# Patient Record
Sex: Female | Born: 1995 | Hispanic: No | Marital: Single | Smoking: Never smoker
Health system: Southern US, Community
[De-identification: ages and names within clinical notes are randomized; demographics above are authoritative.]

## PROBLEM LIST (undated history)

## (undated) DIAGNOSIS — R569 Unspecified convulsions: Secondary | ICD-10-CM

---

## 2017-02-13 ENCOUNTER — Emergency Department (HOSPITAL_COMMUNITY)
Admission: EM | Admit: 2017-02-13 | Discharge: 2017-02-13 | Disposition: A | Discharge status: Home / Self Care | Payer: PRIVATE HEALTH INSURANCE | Attending: Emergency Medicine | Admitting: Emergency Medicine

## 2017-02-13 ENCOUNTER — Encounter (HOSPITAL_COMMUNITY): Payer: Self-pay | Admitting: Emergency Medicine

## 2017-02-13 ENCOUNTER — Emergency Department (HOSPITAL_COMMUNITY): Payer: PRIVATE HEALTH INSURANCE

## 2017-02-13 DIAGNOSIS — Y929 Unspecified place or not applicable: Secondary | ICD-10-CM | POA: Insufficient documentation

## 2017-02-13 DIAGNOSIS — M25561 Pain in right knee: Secondary | ICD-10-CM

## 2017-02-13 DIAGNOSIS — Y998 Other external cause status: Secondary | ICD-10-CM | POA: Insufficient documentation

## 2017-02-13 DIAGNOSIS — M545 Low back pain, unspecified: Secondary | ICD-10-CM

## 2017-02-13 DIAGNOSIS — W010XXA Fall on same level from slipping, tripping and stumbling without subsequent striking against object, initial encounter: Secondary | ICD-10-CM | POA: Diagnosis not present

## 2017-02-13 DIAGNOSIS — Y9301 Activity, walking, marching and hiking: Secondary | ICD-10-CM | POA: Diagnosis not present

## 2017-02-13 DIAGNOSIS — W19XXXA Unspecified fall, initial encounter: Secondary | ICD-10-CM

## 2017-02-13 HISTORY — DX: Unspecified convulsions: R56.9

## 2017-02-13 LAB — POC URINE PREG, ED: PREG TEST UR: NEGATIVE

## 2017-02-13 MED ORDER — IBUPROFEN 400 MG PO TABS
600.0000 mg | ORAL_TABLET | Freq: Once | ORAL | Status: AC
  Administered 2017-02-13: 600 mg via ORAL
  Filled 2017-02-13: qty 1

## 2017-02-13 NOTE — ED Notes (Signed)
Signature pad in room not working at this time. Pt verbalized understanding off all provided materials

## 2017-02-13 NOTE — ED Triage Notes (Signed)
Pt was at [employer] and lost balance in a puddle- fell and hit her right knee. Felt a "pop." Hx of surgery to right knee. Pain in lower back as well. No head/neck pain. No LOC. Pt ambulatory on scene per EMS.

## 2017-02-13 NOTE — ED Provider Notes (Signed)
[facility] Provider Note   CSN: [id number] Arrival date & time: 02/13/17  [id number]     History   Chief Complaint Chief Complaint  Patient presents with  . Knee Pain    HPI Joyce Bender is a 21 y.o. female who presents with right knee pain and low back pain after a fall earlier tonight.  Past medical history significant for multiple surgeries to the right knee.  The patient is currently homeless and is from [place].  She was walking around in the rain all day today with a friend and slipped in a puddle and fell onto her right side and directly onto her right knee.  She reported immediate onset of right knee pain and low back pain especially on the right side.  She has been able to walk after the accident but is having a lot of pain.  No leg weakness, numbness, tingling.  HPI  Past Medical History:  Diagnosis Date  . Seizures (HCC)     There are no active problems to display for this patient.   History reviewed. No pertinent surgical history.  OB History    No data available       Home Medications    Prior to Admission medications   Not on File    Family History History reviewed. No pertinent family history.  Social History Social History   Tobacco Use  . Smoking status: Never Smoker  . Smokeless tobacco: Never Used  Substance Use Topics  . Alcohol use: No    Frequency: Never  . Drug use: No     Allergies   Tape   Review of Systems Review of Systems  Musculoskeletal: Positive for arthralgias and myalgias. Negative for gait problem.  Neurological: Negative for weakness and numbness.     Physical Exam Updated Vital Signs BP 136/74 (BP Location: Right Arm)   Pulse 87   Temp 98.5 F (36.9 C) (Oral)   Resp 15   Ht 5\' 4"  (1.626 m)   Wt 129.7 kg (286 lb)   LMP 12/02/2016   SpO2 100%   BMI 49.09 kg/m   Physical Exam  Constitutional: She is oriented to person, place, and time. She appears well-developed  and well-nourished. No distress.  Obese  HENT:  Head: Normocephalic and atraumatic.  Eyes: Conjunctivae are normal. Pupils are equal, round, and reactive to light. Right eye exhibits no discharge. Left eye exhibits no discharge. No scleral icterus.  Neck: Normal range of motion.  Cardiovascular: Normal rate.  Pulmonary/Chest: Effort normal. No respiratory distress.  Abdominal: She exhibits no distension.  Musculoskeletal:  Back: Inspection: No masses, deformity, or rash Palpation: Lumbar midline spinal tenderness with right sided paraspinal muscle tenderness. Strength: 5/5 in lower extremities and normal plantar and dorsiflexion Gait: Normal gait  Right knee: Multiple prior surgical scars. Tenderness over proximal tibia. No joint line tenderness or effusion. Decreased ROM due to pain. Ambulatory  Neurological: She is alert and oriented to person, place, and time.  Skin: Skin is warm and dry.  Psychiatric: She has a normal mood and affect. Her behavior is normal.  Nursing note and vitals reviewed.    ED Treatments / Results  Labs (all labs ordered are listed, but only abnormal results are displayed) Labs Reviewed  POC URINE PREG, ED    EKG  EKG Interpretation None       Radiology Dg Lumbar Spine Complete  Result Date: 02/13/2017 CLINICAL DATA:  Low back pain after fall. EXAM: LUMBAR  SPINE - COMPLETE 4+ VIEW COMPARISON:  None. FINDINGS: There is no evidence of lumbar spine fracture. Alignment is normal. Intervertebral disc spaces are maintained. IMPRESSION: Negative. Electronically Signed   By: Obie DredgeWilliam T Derry M.D.   On: 02/13/2017 20:26   Dg Knee Complete 4 Views Right  Result Date: 02/13/2017 CLINICAL DATA:  21 year old female with history of right-sided knee pain and low back pain, with recent history of fall and injury to the right knee. EXAM: RIGHT KNEE - COMPLETE 4+ VIEW COMPARISON:  No priors. FINDINGS: Four views of the right knee demonstrate no acute displaced  fracture, subluxation or dislocation. There are 2 fixation screws in the proximal tibia. Small amount of heterotopic ossification medial to the medial supracondylar region of the distal right femur. IMPRESSION: 1. No acute radiographic abnormality of the right knee. Electronically Signed   By: Trudie Reedaniel  Entrikin M.D.   On: 02/13/2017 20:27    Procedures Procedures (including critical care time)  Medications Ordered in ED Medications  ibuprofen (ADVIL,MOTRIN) tablet 600 mg (600 mg Oral Given 02/13/17 2059)     Initial Impression / Assessment and Plan / ED Course  I have reviewed the triage vital signs and the nursing notes.  Pertinent labs & imaging results that were available during my care of the patient were reviewed by me and considered in my medical decision making (see chart for details).  21 year old female presents with acute low back pain and right knee pain s/p fall. Xrays are negative. She was given Ibuprofen for pain and is ambulatory. Social work was consulted who [place]. She verbalized understanding.  Final Clinical Impressions(s) / ED Diagnoses   Final diagnoses:  Acute pain of right knee  Acute bilateral low back pain without sciatica  Fall, initial encounter    ED Discharge Orders    None       Bethel BornGekas, Dannah Ryles Marie, PA-C 02/13/17 2249    Raeford RazorKohut, Stephen, MD 02/14/17 1036

## 2017-02-13 NOTE — ED Triage Notes (Signed)
Pt is currently homeless- has been for a week; states all shelters are full. Pt is from [place] and needs a way to get back home. Pt states she has not eaten since Saturday. Pt will need to see CSW before leaving.

## 2017-02-13 NOTE — Clinical Social Work Note (Signed)
Clinical Social Worker received notification that patient has been homeless and is trying to get back to her family in [place].  CSW spoke with patient who states that she was in town caring for her grandmother since her grandfather passed away.  Patient was no longer able to stay in the home and has been staying outside with no food or money since Saturday.  Patient asked if there were any resources to get back to [place].  CSW contacted patient father, Renato GailsMicahl [place] [phone] to confirm that he in fact was living in [place] and patient would be able to live with him upon her return.  Patient father in agreement.  CSW received approval for [employer] transportation tomorrow at 1:40pm back to [place].  Patient aware and has been given meal vouchers and bus tickets.    Clinical Social Worker will sign off for now as social work intervention is no longer needed. Please consult us again if new need arises.  Macario GoldsJesse Anzel Kearse, [place] [phone]

## 2018-05-05 IMAGING — CR DG KNEE COMPLETE 4+V*R*
4 series · 4 of 4 positions shown · non-contrast
Comparison: No priors.

CLINICAL DATA: 21-year-old female with history of right-sided knee
pain and low back pain, with recent history of fall and injury to
the right knee.

EXAM:
RIGHT KNEE - COMPLETE 4+ VIEW

[knee ap]
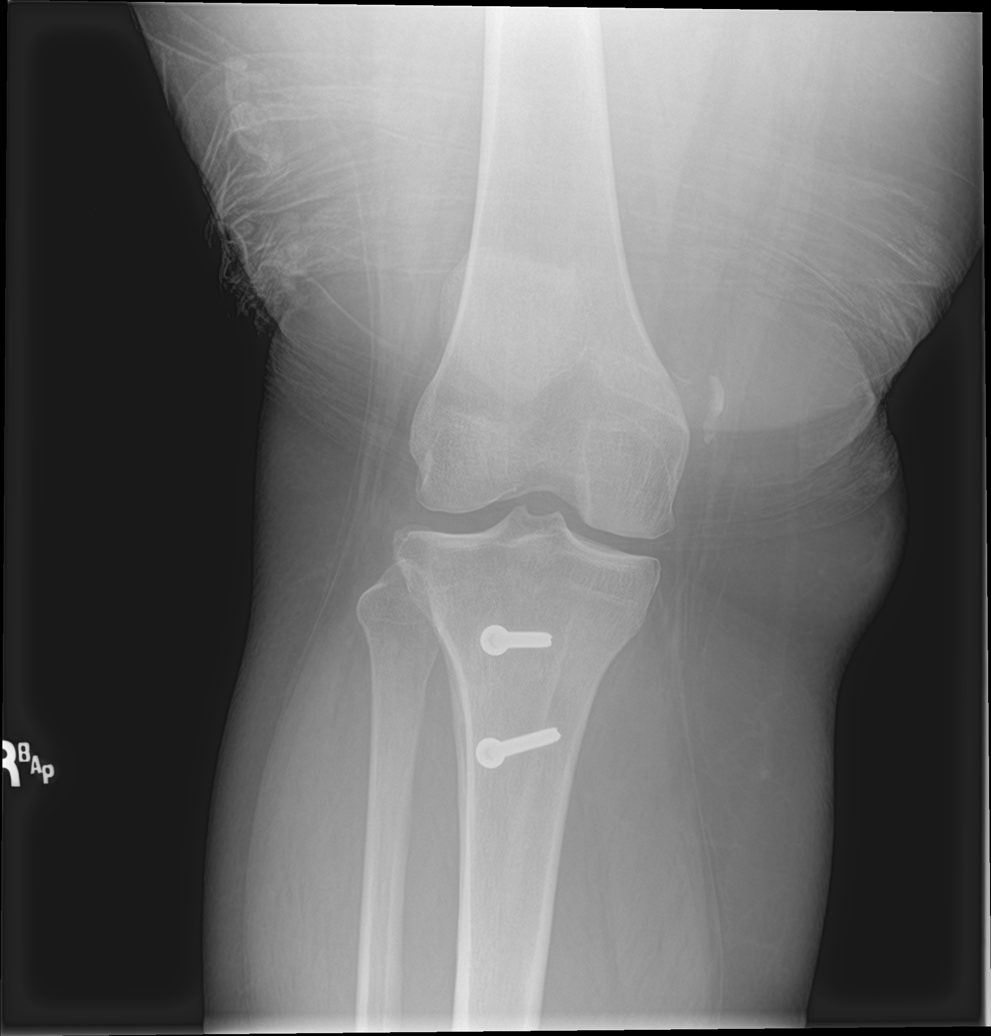

[knee lat]
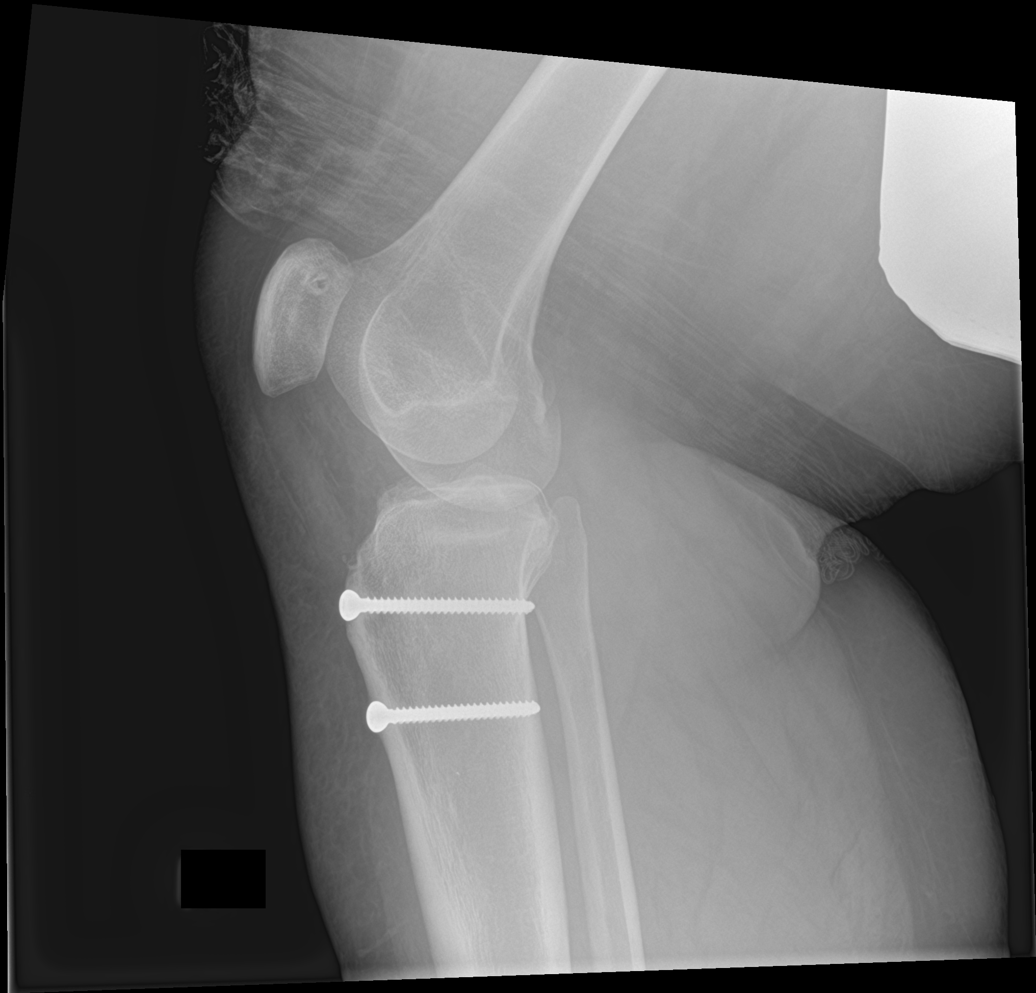

[knee obl (1 of 2)]
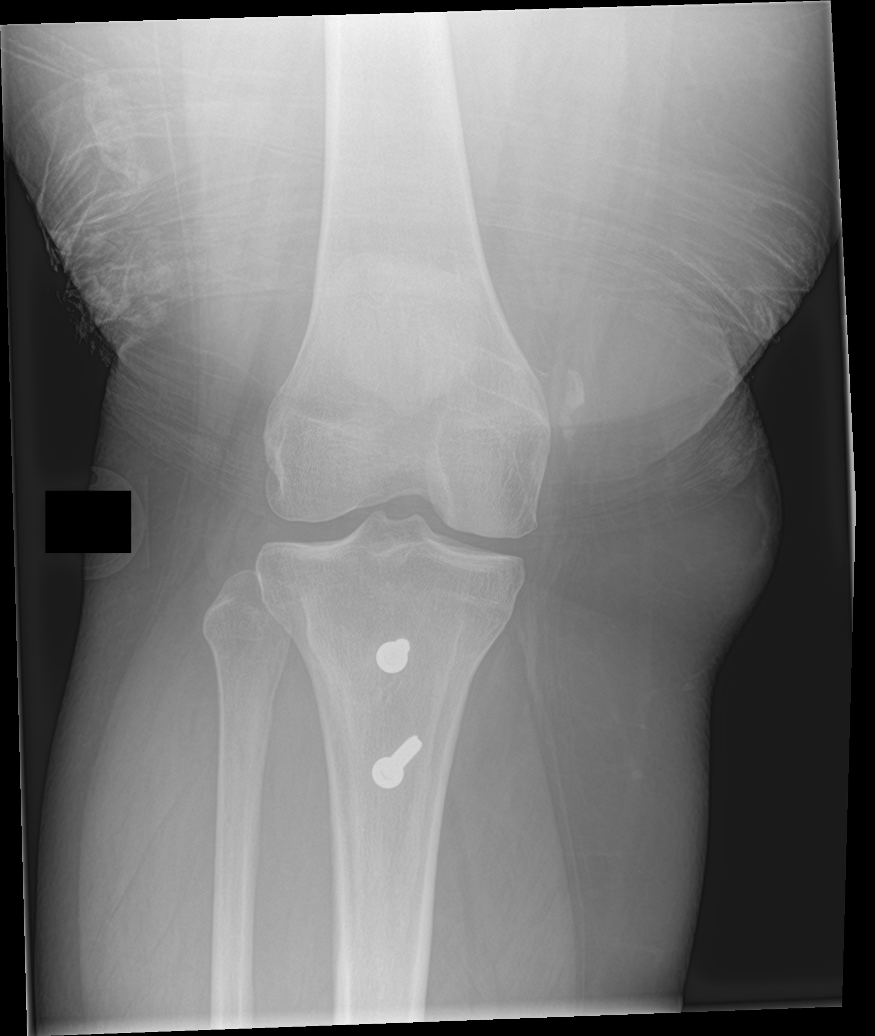

[knee obl (2 of 2)]
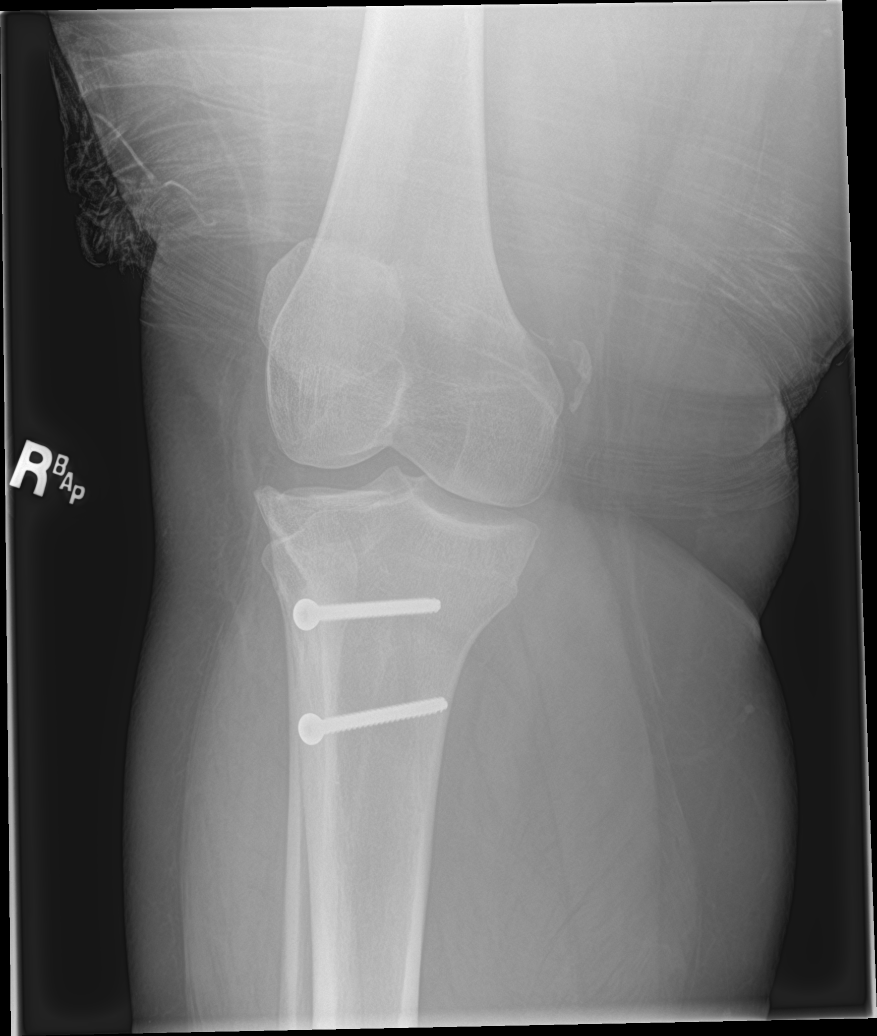

[4 of 4 positions shown; findings below may reference images not displayed]

FINDINGS: Four views of the right knee demonstrate no acute displaced
fracture, subluxation or dislocation. There are 2 fixation screws in
the proximal tibia. Small amount of heterotopic ossification medial
to the medial supracondylar region of the distal right femur.
IMPRESSION: 1. No acute radiographic abnormality of the right knee.

## 2018-05-05 IMAGING — CR DG LUMBAR SPINE COMPLETE 4+V
5 series · 5 of 5 positions shown · non-contrast
Comparison: None.

CLINICAL DATA: Low back pain after fall.

EXAM:
LUMBAR SPINE - COMPLETE 4+ VIEW

[l-spine obl (1 of 2)]
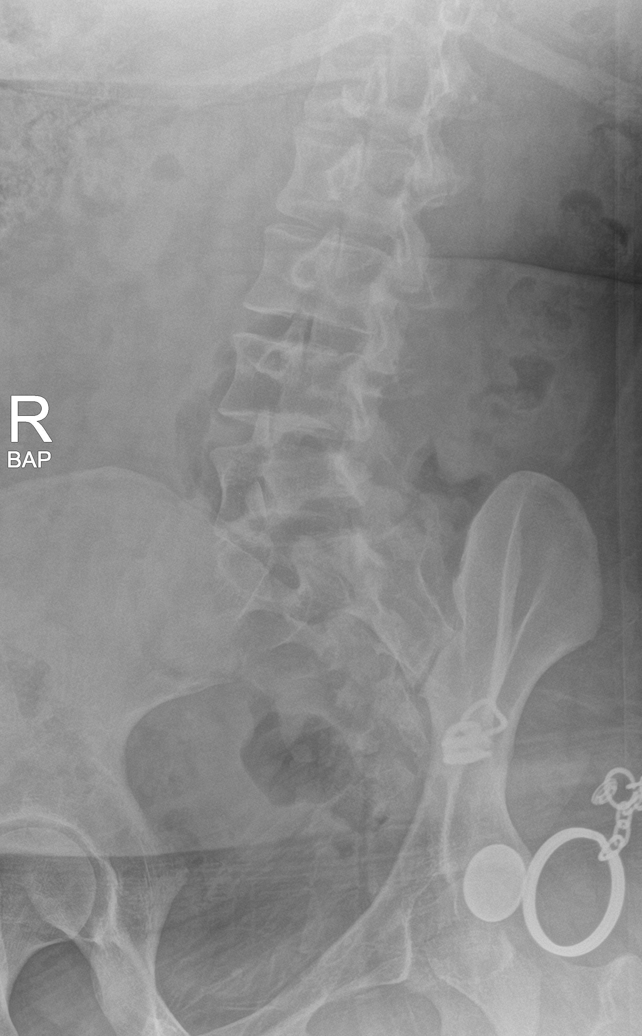

[l-spine obl (2 of 2)]
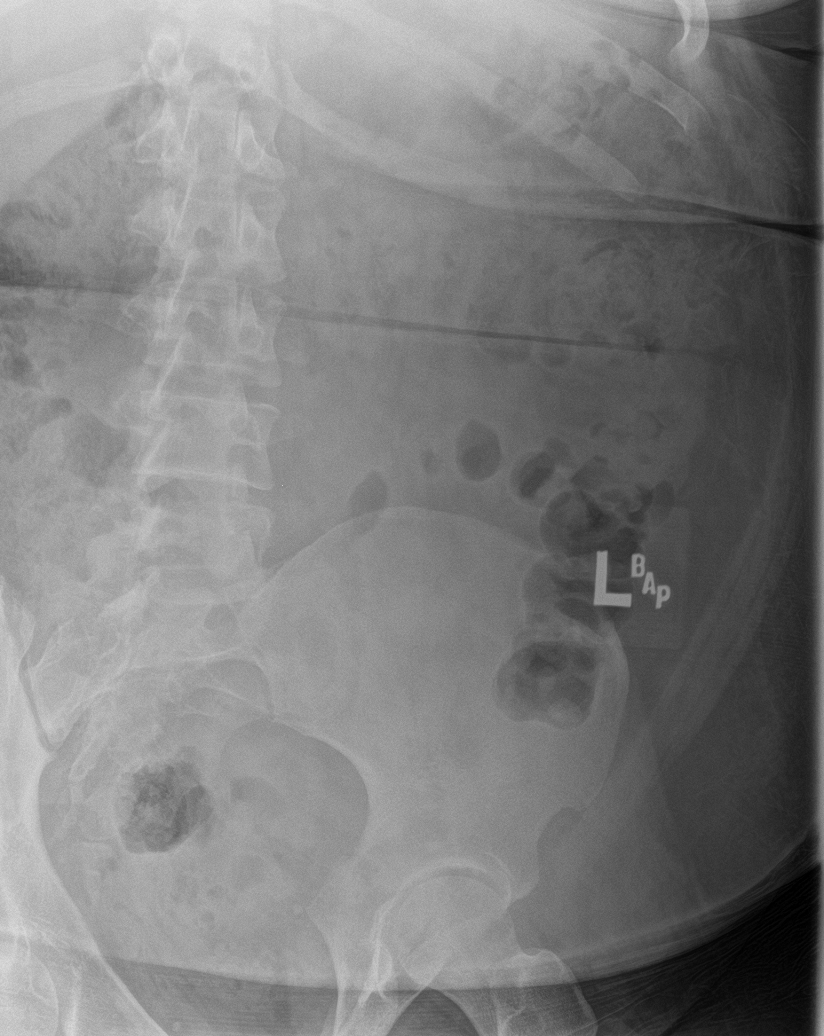

[l-spine lat]
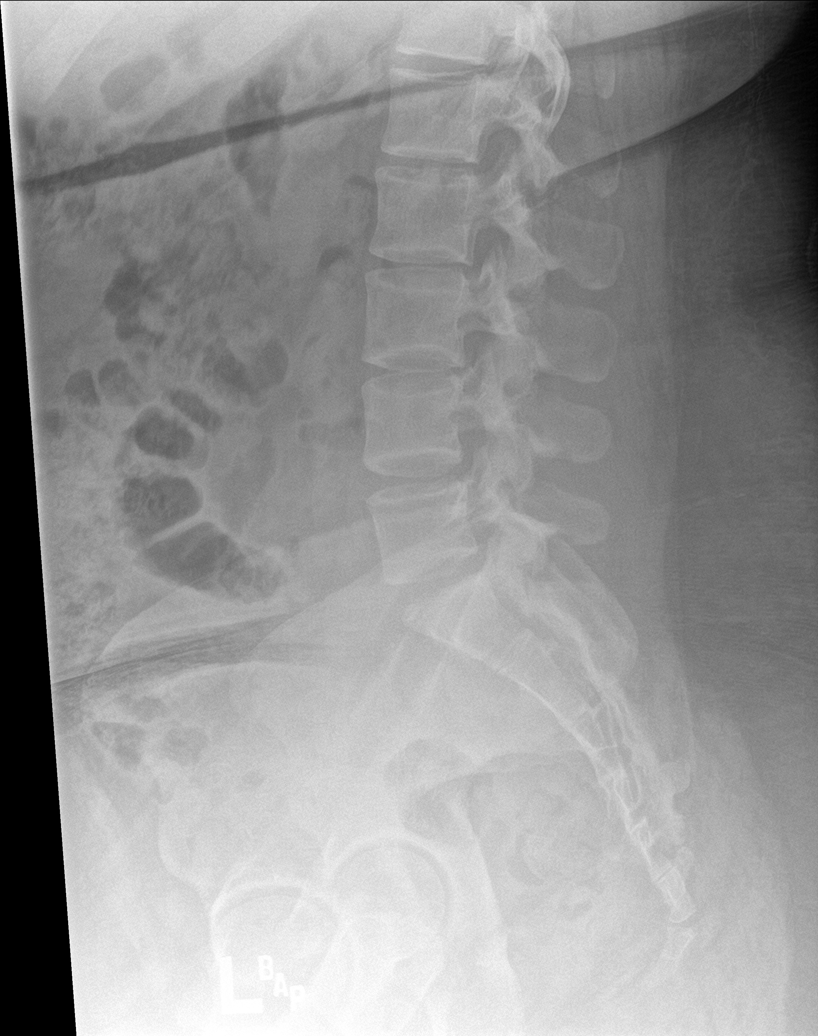

[l-spine spot]
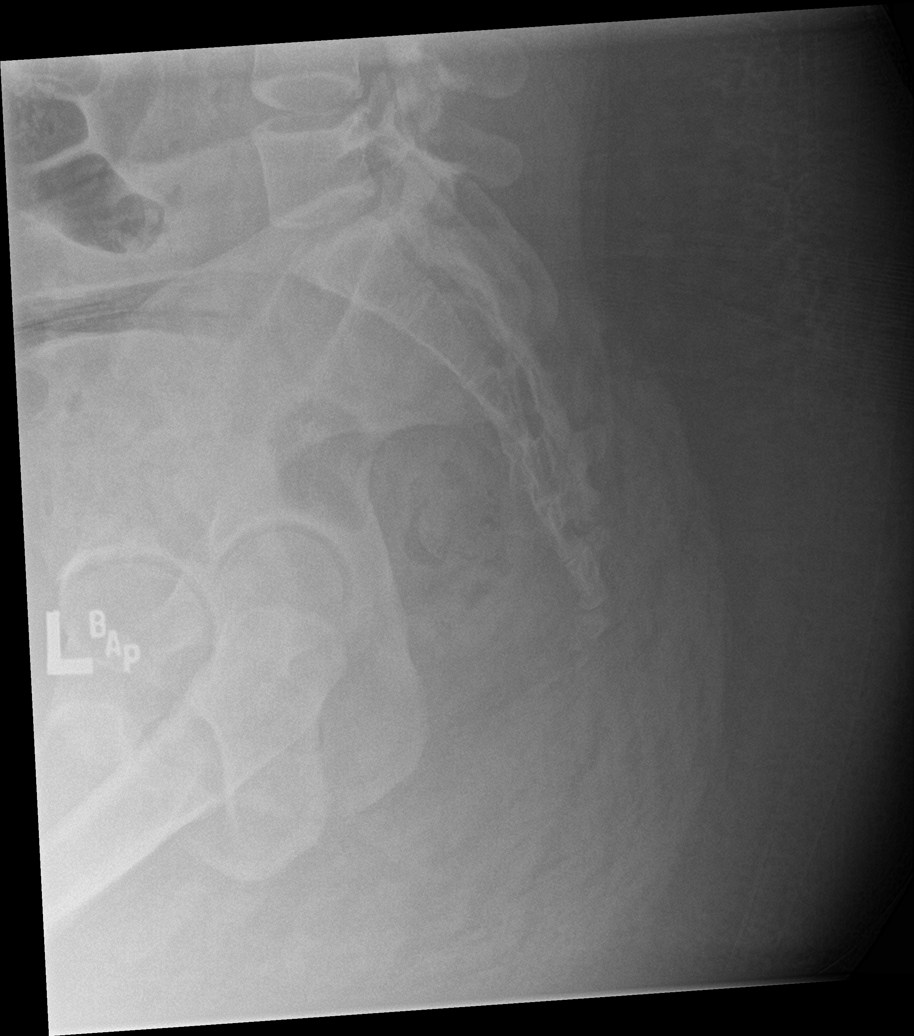

[l-spine ap]
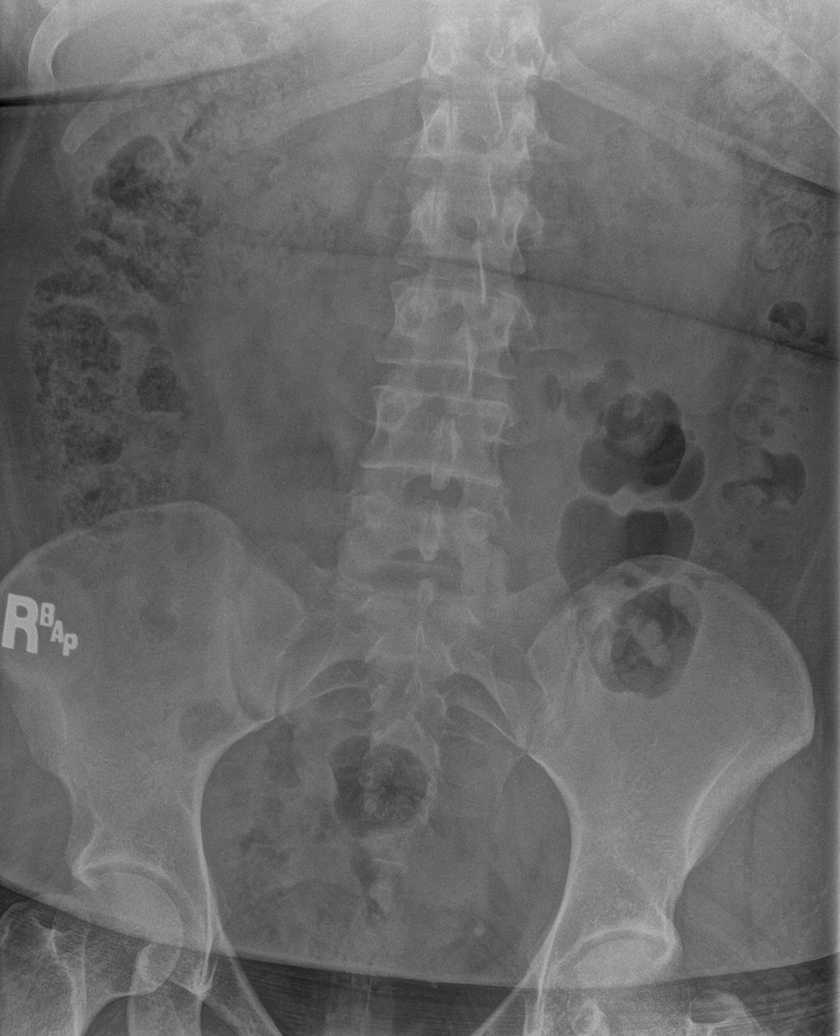

[5 of 5 positions shown; findings below may reference images not displayed]

FINDINGS: There is no evidence of lumbar spine fracture. Alignment is normal.
Intervertebral disc spaces are maintained.
IMPRESSION: Negative.
